# Patient Record
Sex: Male | Born: 1995 | Hispanic: Yes | Marital: Single
Health system: Southern US, Community
[De-identification: ages and names within clinical notes are randomized; demographics above are authoritative.]

## PROBLEM LIST (undated history)

## (undated) DIAGNOSIS — K5010 Crohn's disease of large intestine without complications: Secondary | ICD-10-CM

---
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
## 2013-04-22 DIAGNOSIS — K5090 Crohn's disease, unspecified, without complications: Secondary | ICD-10-CM | POA: Insufficient documentation

## 2014-12-02 ENCOUNTER — Emergency Department: Payer: Self-pay | Admitting: Emergency Medicine

## 2016-07-09 ENCOUNTER — Encounter: Payer: Self-pay | Admitting: Medical Oncology

## 2016-07-09 ENCOUNTER — Emergency Department
Admission: EM | Admit: 2016-07-09 | Discharge: 2016-07-09 | Disposition: A | Discharge status: Home / Self Care | Payer: Medicaid Other | Attending: Emergency Medicine | Admitting: Emergency Medicine

## 2016-07-09 DIAGNOSIS — R21 Rash and other nonspecific skin eruption: Secondary | ICD-10-CM | POA: Diagnosis present

## 2016-07-09 DIAGNOSIS — B029 Zoster without complications: Secondary | ICD-10-CM | POA: Insufficient documentation

## 2016-07-09 HISTORY — DX: Crohn's disease of large intestine without complications: K50.10

## 2016-07-09 MED ORDER — GABAPENTIN 300 MG PO CAPS: 300.0000 mg | ORAL_CAPSULE | Freq: Three times a day (TID) | ORAL | 0 refills | Status: AC

## 2016-07-09 MED ORDER — LIDOCAINE 5 % EX OINT: 1.0000 "application " | TOPICAL_OINTMENT | CUTANEOUS | 0 refills | Status: AC | PRN

## 2016-07-09 MED ORDER — TRIAMCINOLONE ACETONIDE 0.5 % EX OINT: 1.0000 "application " | TOPICAL_OINTMENT | Freq: Two times a day (BID) | CUTANEOUS | 0 refills | Status: AC

## 2016-07-09 MED ORDER — HYDROCODONE-ACETAMINOPHEN 5-325 MG PO TABS: 1.0000 | ORAL_TABLET | Freq: Four times a day (QID) | ORAL | 0 refills | Status: AC | PRN

## 2016-07-09 MED ORDER — ACYCLOVIR 800 MG PO TABS: 800.0000 mg | ORAL_TABLET | Freq: Every day | ORAL | 0 refills | Status: AC

## 2016-07-09 MED ORDER — PREDNISONE 10 MG (21) PO TBPK: ORAL_TABLET | ORAL | 0 refills | Status: AC

## 2016-07-09 NOTE — ED Triage Notes (Addendum)
Pt reports that he has rash to his back that is painful.

## 2016-07-09 NOTE — Discharge Instructions (Signed)
Take the prescription meds as directed. Use the steroid cream and lidocaine ointment as needed. Follow-up with [facility] as needed.

## 2016-07-09 NOTE — ED Notes (Signed)
See triage note...Luke Diaz has pustule rash to L shoulder blade.  Pt denies pain at this time.  No open sores noted.

## 2016-07-10 ENCOUNTER — Encounter: Payer: Self-pay | Admitting: Physician Assistant

## 2016-07-10 NOTE — ED Provider Notes (Signed)
[facility] Emergency Department Provider Note ____________________________________________  Time seen: 1823  I have reviewed the triage vital signs and the nursing notes.  HISTORY  Chief Complaint  Rash  HPI Luke Luepke. is a 20 y.o. male presents to the ED with the onset of a painful rash that he noted yesterday. Patient describes a painful, blistery rash that is present to his left chest as well as to his left scapulothoracic region. He denies any particular contacts, triggers, or allergies. The patient does give a history of chickenpox infection as a toddler. He has been using triple antibiotic ointment to the rash without significant benefit. He describes the rash as sharp and painful and stabbing at times. He denies any fevers, chills, or sweats.  Past Medical History:  Diagnosis Date  . Crohn's colitis (HCC)    There are no active problems to display for this patient.  No past surgical history on file.  Current Outpatient Rx  . Order #: [id number] Class: Print  . Order #: [id number] Class: Print  . Order #: [id number] Class: Print  . Order #: [id number] Class: Print  . Order #: [id number] Class: Print  . Order #: [id number] Class: Print   Allergies Review of patient's allergies indicates no known allergies.  No family history on file.  Social History Social History  Substance Use Topics  . Smoking status: Not on file  . Smokeless tobacco: Not on file  . Alcohol use Not on file   Review of Systems  Constitutional: Negative for fever. Cardiovascular: Negative for chest pain. Respiratory: Negative for shortness of breath. Gastrointestinal: Negative for abdominal pain, vomiting and diarrhea. Musculoskeletal: Negative for back pain. Skin: Positive for painful rash. Neurological: Negative for headaches, focal weakness or numbness. ____________________________________________  PHYSICAL EXAM:  VITAL SIGNS: ED Triage Vitals [07/09/16 1751]   Enc Vitals Group     BP 136/87     Pulse Rate 96     Resp 18     Temp 98.4 F (36.9 C)     Temp Source Oral     SpO2 97 %     Weight 229 lb (103.9 kg)     Height 5\' 3"  (1.6 m)     Head Circumference      Peak Flow      Pain Score 0     Pain Loc      Pain Edu?      Excl. in GC?    Constitutional: Alert and oriented. Well appearing and in no distress. Head: Normocephalic and atraumatic. Musculoskeletal: Nontender with normal range of motion in all extremities.  Neurologic:  Normal gait without ataxia. Normal speech and language. No gross focal neurologic deficits are appreciated. Skin:  Skin is warm, dry and intact. Patient with a large blistery rash noted to be in a single dermatomal distribution over the left thoracic region. Some of the coalesced blisters have an erythematous base. ____________________________________________  INITIAL IMPRESSION / ASSESSMENT AND PLAN / ED COURSE  Patient with clinical presentation consistent with an acute shingles outbreak. Patient is discharged with multiple prescriptions including acyclovir, triamcinolone ointment, lidocaine ointment, prednisone, gabapentin and hydrocodone. He is given wound care instructions and information about the treatment, prognosis, and course of this self-limited viral outbreak. He will follow up with [facility] for ongoing symptom management.  Clinical Course   ____________________________________________  FINAL CLINICAL IMPRESSION(S) / ED DIAGNOSES  Final diagnoses:  Shingles     Lissa Hoard, PA-C 07/10/16 0017    Sharyn Creamer, MD 07/10/16 [phone]

## 2019-12-31 DIAGNOSIS — D500 Iron deficiency anemia secondary to blood loss (chronic): Secondary | ICD-10-CM | POA: Insufficient documentation

## 2023-10-01 ENCOUNTER — Encounter: Payer: Self-pay | Admitting: Physician Assistant

## 2023-10-01 ENCOUNTER — Telehealth: Payer: Self-pay

## 2023-10-01 DIAGNOSIS — K909 Intestinal malabsorption, unspecified: Secondary | ICD-10-CM | POA: Insufficient documentation

## 2023-10-01 DIAGNOSIS — E559 Vitamin D deficiency, unspecified: Secondary | ICD-10-CM | POA: Insufficient documentation

## 2023-10-01 DIAGNOSIS — R7303 Prediabetes: Secondary | ICD-10-CM | POA: Insufficient documentation

## 2023-10-01 NOTE — Telephone Encounter (Signed)
Copied from CRM [id number]. Topic: Appointment Scheduling - Scheduling Inquiry for Clinic >> Oct 01, 2023 12:29 PM Payton Doughty wrote: Reason for CRM: pt's father Luke Diaz is a pt of Dan's. Pt would like to know if [name] has time to work him in as a new pt sooner than his 12/09 appt.  Pt states he has been dx w/ pre diabetes.

## 2023-10-02 NOTE — Telephone Encounter (Signed)
Left patient a detailed message informing of this.  - Luke Diaz

## 2023-11-17 ENCOUNTER — Ambulatory Visit: Payer: Medicaid Other | Admitting: Physician Assistant
